# Patient Record
Sex: Male | Born: 1985 | Race: White | Hispanic: No | Marital: Single | Smoking: Never smoker
Health system: Southern US, Community
[De-identification: ages and names within clinical notes are randomized; demographics above are authoritative.]

## PROBLEM LIST (undated history)

## (undated) DIAGNOSIS — F319 Bipolar disorder, unspecified: Secondary | ICD-10-CM

## (undated) DIAGNOSIS — S32009A Unspecified fracture of unspecified lumbar vertebra, initial encounter for closed fracture: Secondary | ICD-10-CM

## (undated) HISTORY — PX: COSMETIC SURGERY: SHX468

---
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
## 2007-03-06 ENCOUNTER — Emergency Department (HOSPITAL_COMMUNITY): Admission: EM | Admit: 2007-03-06 | Discharge: 2007-03-06 | Payer: Self-pay | Admitting: Family Medicine

## 2007-03-15 ENCOUNTER — Ambulatory Visit (HOSPITAL_BASED_OUTPATIENT_CLINIC_OR_DEPARTMENT_OTHER): Admission: RE | Admit: 2007-03-15 | Discharge: 2007-03-15 | Payer: Self-pay | Admitting: Otolaryngology
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
## 2011-06-12 LAB — POCT HEMOGLOBIN-HEMACUE
Hemoglobin: 17
Operator id: 128471

## 2014-04-08 ENCOUNTER — Ambulatory Visit
Admission: RE | Admit: 2014-04-08 | Discharge: 2014-04-08 | Disposition: A | Discharge status: Home / Self Care | Payer: 59 | Source: Ambulatory Visit | Attending: Physician Assistant | Admitting: Physician Assistant

## 2014-04-08 ENCOUNTER — Other Ambulatory Visit: Payer: Self-pay | Admitting: Physician Assistant

## 2014-04-08 DIAGNOSIS — R509 Fever, unspecified: Secondary | ICD-10-CM

## 2014-04-09 ENCOUNTER — Other Ambulatory Visit: Payer: Self-pay | Admitting: Family Medicine

## 2014-04-09 DIAGNOSIS — R945 Abnormal results of liver function studies: Principal | ICD-10-CM

## 2014-04-09 DIAGNOSIS — R7989 Other specified abnormal findings of blood chemistry: Secondary | ICD-10-CM

## 2014-04-13 ENCOUNTER — Ambulatory Visit
Admission: RE | Admit: 2014-04-13 | Discharge: 2014-04-13 | Disposition: A | Discharge status: Home / Self Care | Payer: 59 | Source: Ambulatory Visit | Attending: Family Medicine | Admitting: Family Medicine

## 2014-04-13 DIAGNOSIS — R945 Abnormal results of liver function studies: Principal | ICD-10-CM

## 2014-04-13 DIAGNOSIS — R7989 Other specified abnormal findings of blood chemistry: Secondary | ICD-10-CM

## 2014-04-16 ENCOUNTER — Other Ambulatory Visit: Payer: 59

## 2014-12-13 ENCOUNTER — Emergency Department (INDEPENDENT_AMBULATORY_CARE_PROVIDER_SITE_OTHER)
Admission: EM | Admit: 2014-12-13 | Discharge: 2014-12-13 | Disposition: A | Discharge status: Home / Self Care | Payer: Worker's Compensation | Source: Home / Self Care | Attending: Family Medicine | Admitting: Family Medicine

## 2014-12-13 ENCOUNTER — Encounter (HOSPITAL_COMMUNITY): Payer: Self-pay | Admitting: *Deleted

## 2014-12-13 DIAGNOSIS — M545 Low back pain, unspecified: Secondary | ICD-10-CM

## 2014-12-13 HISTORY — DX: Bipolar disorder, unspecified: F31.9

## 2014-12-13 HISTORY — DX: Unspecified fracture of unspecified lumbar vertebra, initial encounter for closed fracture: S32.009A

## 2014-12-13 MED ORDER — TIZANIDINE HCL 4 MG PO TABS: 4.0000 mg | ORAL_TABLET | Freq: Three times a day (TID) | ORAL | Status: AC | PRN

## 2014-12-13 NOTE — Discharge Instructions (Signed)
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
## 2014-12-13 NOTE — ED Notes (Signed)
Pt reports feeling a "pop" in left low back when standing up out of a chair while at work @ approx 0700 today.  C/O pain across low back (R>L) without radiation.  Denies parasthesias.  Has taken IBU.

## 2017-01-10 ENCOUNTER — Other Ambulatory Visit: Payer: Self-pay | Admitting: Physical Medicine and Rehabilitation

## 2017-01-10 DIAGNOSIS — G8929 Other chronic pain: Secondary | ICD-10-CM

## 2017-01-10 DIAGNOSIS — M545 Low back pain: Principal | ICD-10-CM

## 2017-01-12 ENCOUNTER — Ambulatory Visit
Admission: RE | Admit: 2017-01-12 | Discharge: 2017-01-12 | Disposition: A | Discharge status: Home / Self Care | Payer: Commercial Managed Care - HMO | Source: Ambulatory Visit | Attending: Physical Medicine and Rehabilitation | Admitting: Physical Medicine and Rehabilitation

## 2017-01-12 DIAGNOSIS — G8929 Other chronic pain: Secondary | ICD-10-CM

## 2017-01-12 DIAGNOSIS — M545 Low back pain: Principal | ICD-10-CM

## 2019-02-23 IMAGING — MR MR LUMBAR SPINE W/O CM
4 of 5 series · 18 of 48 positions shown · non-contrast
Comparison: [REDACTED] Lumbar MRI
09/08/2009

CLINICAL DATA: 30-year-old male with chronic intense lumbar back
pain radiating to both legs. Progressive symptoms.

EXAM:
MRI LUMBAR SPINE WITHOUT CONTRAST
TECHNIQUE: Multiplanar, multisequence MR imaging of the lumbar spine was
performed. No intravenous contrast was administered.

[Series 6: T2 · sagittal · 4.0mm · 0.73mm/px · 6 of 13 slices shown (1 of 2)]
[im 1/13]
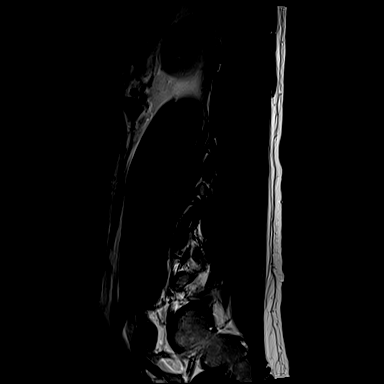
[im 3/13]
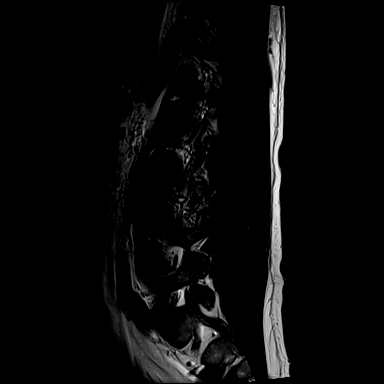
[im 5/13]
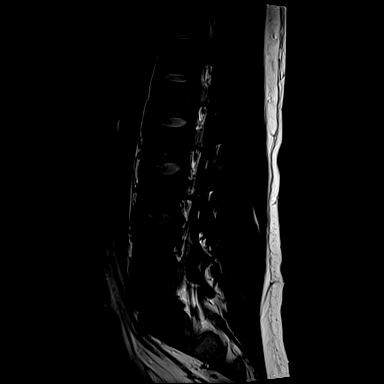
[im 8/13]
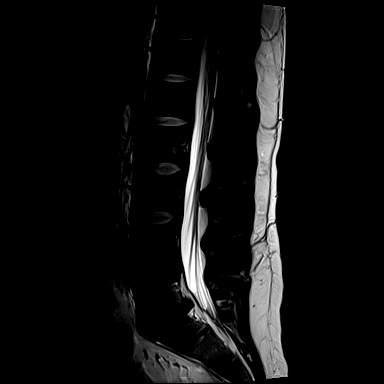
[im 10/13]
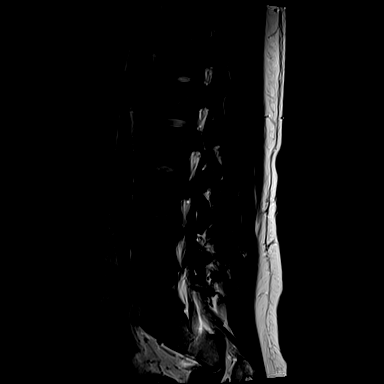
[im 13/13]
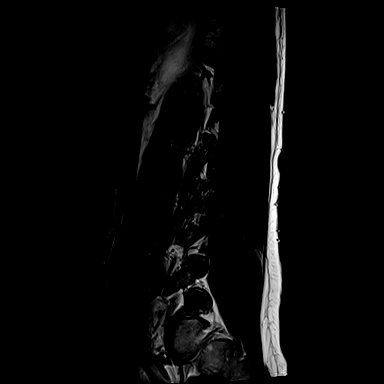

[Series 7: T1 · sagittal · 4.0mm · 0.73mm/px · 3 of 13 slices shown (1 of 2)]
[im 3/13]
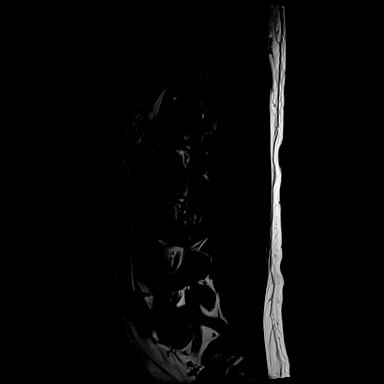
[im 8/13]
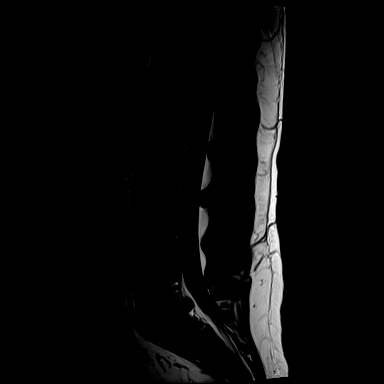
[im 13/13]
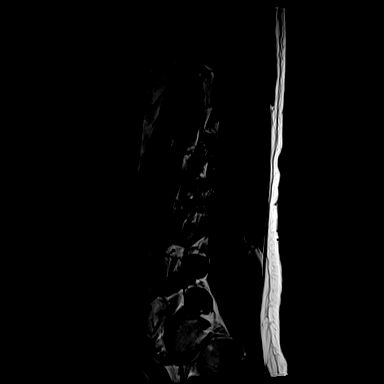

[Series 11: T1 · axial · 4.0mm · 0.28mm/px · z∈[-105,+27]mm · 3 of 33 slices shown (2 of 2)]
[im 5/33]
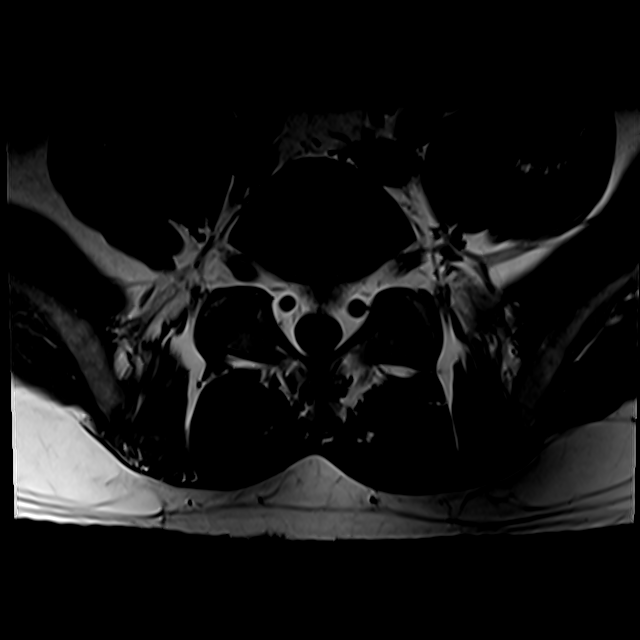
[im 17/33]
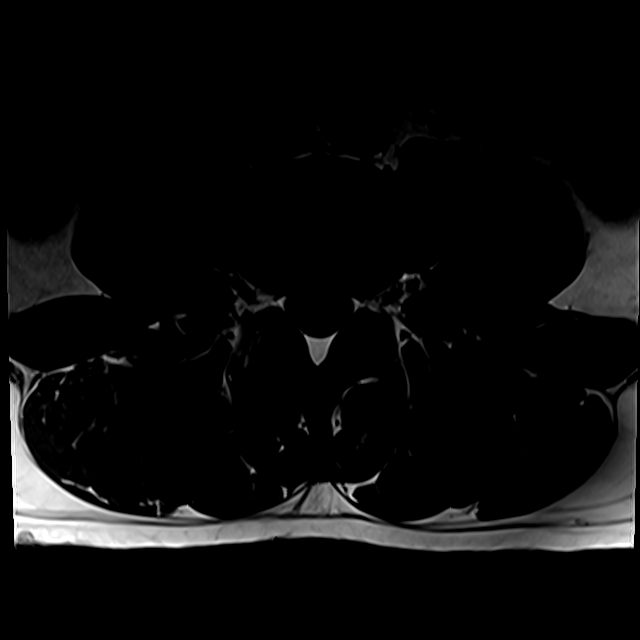
[im 28/33]
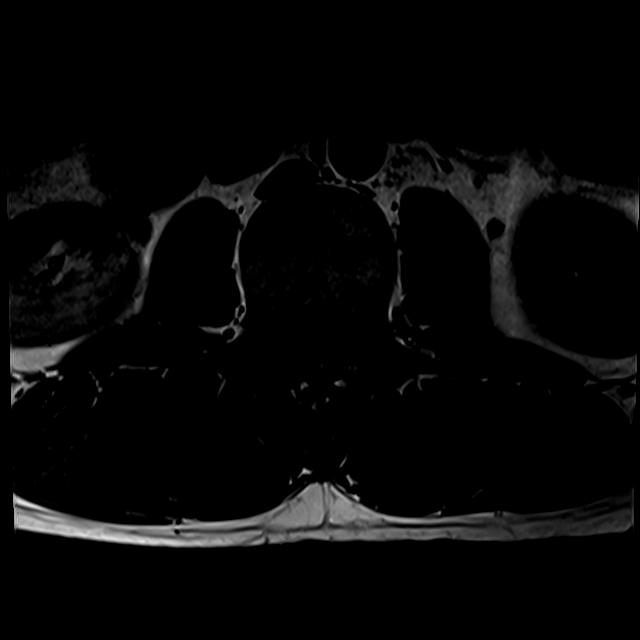

[Series 14: T2 · axial · 4.0mm · 0.28mm/px · z∈[-125,+27]mm · 6 of 33 slices shown (2 of 2)]
[im 1/33]
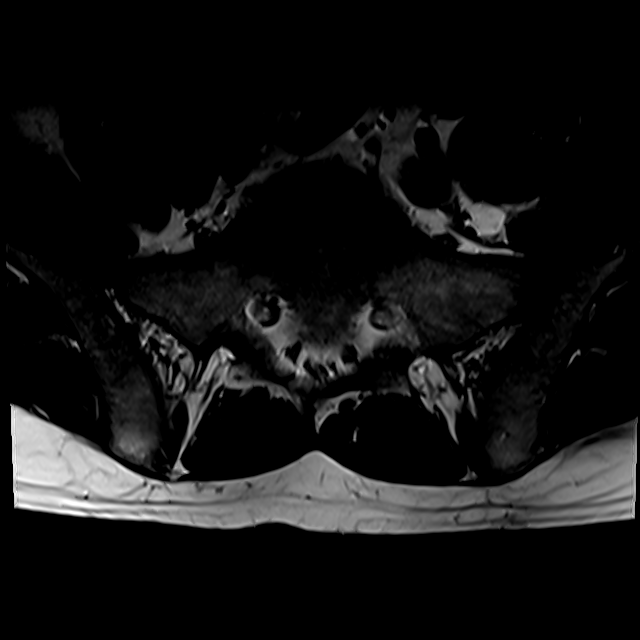
[im 5/33]
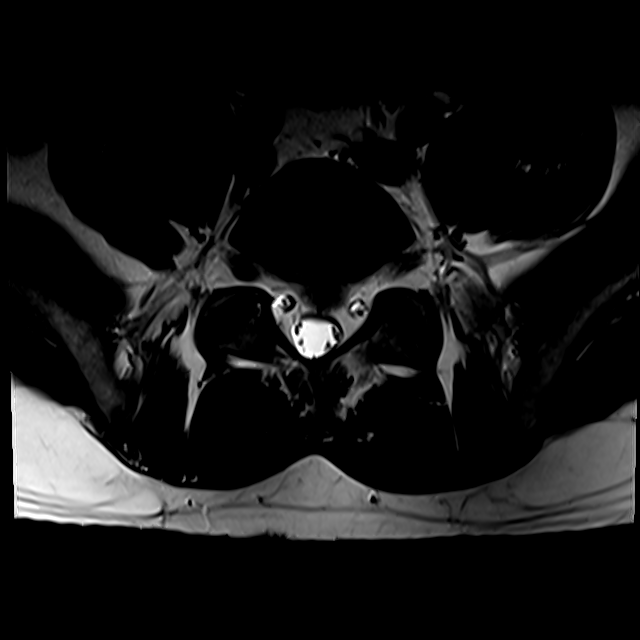
[im 10/33]
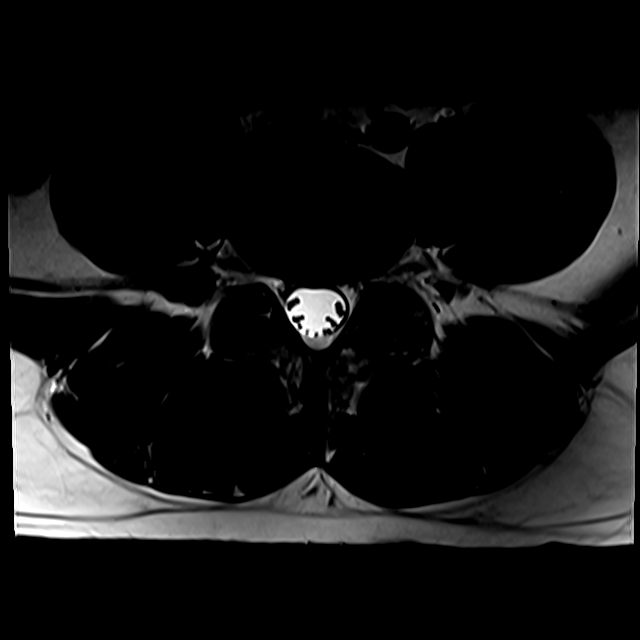
[im 14/33]
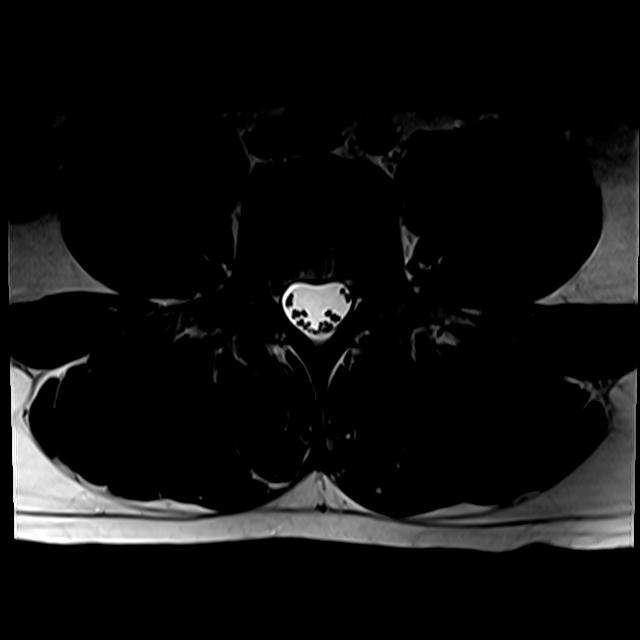
[im 17/33]
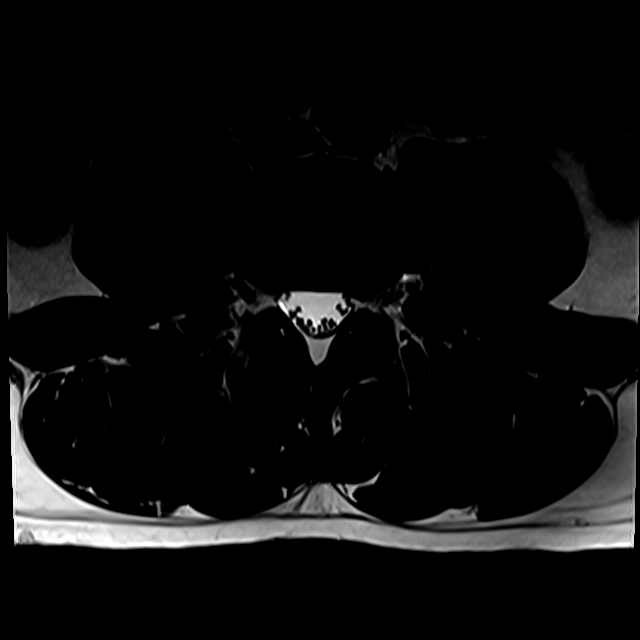
[im 28/33]
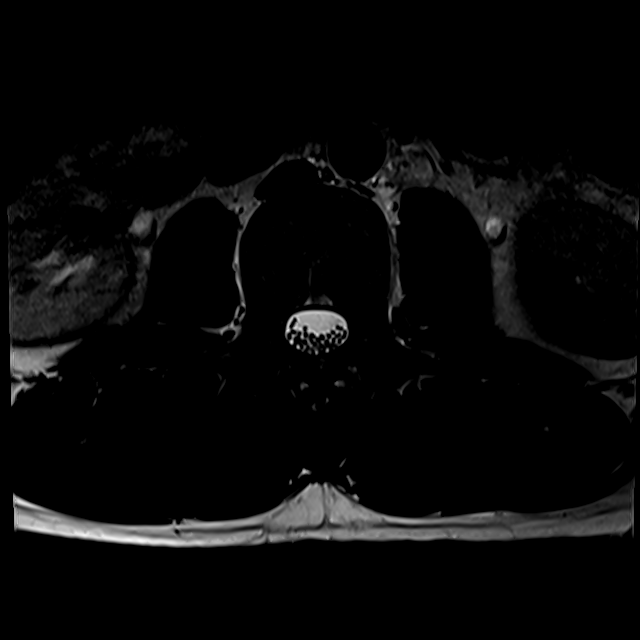

[18 of 48 positions shown; findings below may reference images not displayed]

FINDINGS: Segmentation: Lumbar segmentation appears to be normal Which
corresponds to the same numbering system used in 8777.

Alignment:  Stable.  Chronic straightening of lumbar lordosis.

Vertebrae: No marrow edema or evidence of acute osseous abnormality.
Visualized bone marrow signal is within normal limits. Negative
visible sacrum.

Conus medullaris: Extends to the T12-L1 level and appears normal.

Paraspinal and other soft tissues: Negative.

Disc levels:

T11-T12: Small chronic superior endplate Schmorl nodes at T12,
otherwise negative.

T12-L1:  Negative.

L1-L2:  Negative.

L2-L3:  Negative.

L3-L4:  Negative.

L4-L5: Progression of disc desiccation since 8777. At the same time,
a small central broad-based disc protrusion seen in 8777 has mildly
regressed. There is borderline to mild facet hypertrophy. No
significant stenosis.

L5-S1: Chronic disc desiccation. Mild disc loss. Mild
circumferential disc bulge. Progressed broad-based central disc
protrusion where previously a small annular fissure and subtle
protrusion were present. This occupies the ventral epidural space
(series 14, image 30) and does not appear to directly contact the
descending S1 nerve roots in the lateral recesses. Mild epidural
lipomatosis at this level. No spinal stenosis or foraminal
involvement.
IMPRESSION: 1. Progressed disc degeneration at L5-S1 with a broad-based small to
moderate size central disc herniation. Disc material is in proximity
to the descending S1 nerve roots in the lateral recesses, and could
be a source for S1 radiculitis, although no spinal stenosis or
direct neural impingement is evident.
2. Mild chronic L4-L5 disc degeneration, although a subtle disc
herniation seen there in 8777 has regressed.

## 2019-09-02 ENCOUNTER — Other Ambulatory Visit: Payer: Self-pay

## 2019-09-02 DIAGNOSIS — Z20822 Contact with and (suspected) exposure to covid-19: Secondary | ICD-10-CM

## 2019-09-03 LAB — NOVEL CORONAVIRUS, NAA: SARS-CoV-2, NAA: NOT DETECTED

## 2020-03-17 ENCOUNTER — Other Ambulatory Visit: Payer: Self-pay | Admitting: Physical Medicine and Rehabilitation

## 2020-03-18 ENCOUNTER — Other Ambulatory Visit: Payer: Self-pay | Admitting: Physical Medicine and Rehabilitation

## 2020-03-18 DIAGNOSIS — M549 Dorsalgia, unspecified: Secondary | ICD-10-CM

## 2020-03-18 DIAGNOSIS — M51379 Other intervertebral disc degeneration, lumbosacral region without mention of lumbar back pain or lower extremity pain: Secondary | ICD-10-CM

## 2020-03-18 DIAGNOSIS — M545 Low back pain, unspecified: Secondary | ICD-10-CM

## 2020-03-18 DIAGNOSIS — M5137 Other intervertebral disc degeneration, lumbosacral region: Secondary | ICD-10-CM

## 2020-04-11 ENCOUNTER — Ambulatory Visit
Admission: RE | Admit: 2020-04-11 | Discharge: 2020-04-11 | Disposition: A | Discharge status: Home / Self Care | Payer: Commercial Managed Care - HMO | Source: Ambulatory Visit | Attending: Physical Medicine and Rehabilitation | Admitting: Physical Medicine and Rehabilitation

## 2020-04-11 ENCOUNTER — Other Ambulatory Visit: Payer: Commercial Managed Care - HMO

## 2020-04-11 DIAGNOSIS — M549 Dorsalgia, unspecified: Secondary | ICD-10-CM

## 2020-04-11 DIAGNOSIS — M5137 Other intervertebral disc degeneration, lumbosacral region: Secondary | ICD-10-CM

## 2020-04-11 DIAGNOSIS — M545 Low back pain, unspecified: Secondary | ICD-10-CM

## 2023-01-14 ENCOUNTER — Ambulatory Visit
Admission: EM | Admit: 2023-01-14 | Discharge: 2023-01-14 | Disposition: A | Discharge status: Home / Self Care | Payer: 59 | Attending: Internal Medicine | Admitting: Internal Medicine

## 2023-01-14 DIAGNOSIS — R051 Acute cough: Secondary | ICD-10-CM | POA: Diagnosis not present

## 2023-01-14 DIAGNOSIS — J069 Acute upper respiratory infection, unspecified: Secondary | ICD-10-CM

## 2023-01-14 DIAGNOSIS — J029 Acute pharyngitis, unspecified: Secondary | ICD-10-CM | POA: Diagnosis not present

## 2023-01-14 LAB — POCT MONO SCREEN (KUC): Mono, POC: NEGATIVE

## 2023-01-14 MED ORDER — AMOXICILLIN-POT CLAVULANATE 875-125 MG PO TABS: 1.0000 | ORAL_TABLET | Freq: Two times a day (BID) | ORAL | 0 refills | Status: AC

## 2023-01-14 NOTE — Discharge Instructions (Signed)
Your monotest was negative.  I have prescribed an antibiotic for you to take for upper respiratory symptoms, sore throat, cough.  Please follow-up if any symptoms persist or not worsen.
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
## 2023-01-14 NOTE — ED Triage Notes (Signed)
Pt c/o sore throat, mild cough, reports completing azithromycin therapy which did not offer relief. Associated fatigue, malaise. Reports tested covid and strep (-) a few days ago.   Onset ~ 9 days ago

## 2023-04-23 ENCOUNTER — Ambulatory Visit
Admission: EM | Admit: 2023-04-23 | Discharge: 2023-04-23 | Disposition: A | Discharge status: Home / Self Care | Payer: 59 | Attending: Family Medicine | Admitting: Family Medicine

## 2023-04-23 DIAGNOSIS — K30 Functional dyspepsia: Secondary | ICD-10-CM | POA: Insufficient documentation

## 2023-04-23 DIAGNOSIS — R5383 Other fatigue: Secondary | ICD-10-CM | POA: Diagnosis present

## 2023-04-23 DIAGNOSIS — R739 Hyperglycemia, unspecified: Secondary | ICD-10-CM | POA: Diagnosis present

## 2023-04-23 DIAGNOSIS — Z20822 Contact with and (suspected) exposure to covid-19: Secondary | ICD-10-CM | POA: Insufficient documentation

## 2023-04-23 LAB — POCT FASTING CBG KUC MANUAL ENTRY: POCT Glucose (KUC): 155 mg/dL — AB (ref 70–99)

## 2023-04-23 NOTE — ED Triage Notes (Signed)
"  I just started with diarrhea on Friday morning, this remains, nausea now that increased to occasional dizziness". "Very weak today and difficulty concentrating". ? COVID19 exposure. Occasional cough. No runny nose. No sob. No chest pain. No vomiting.

## 2023-04-23 NOTE — Discharge Instructions (Addendum)
You were seen today for not feeling well.  This is likely due to several causes.   We have done a covid test, and blood work today, which will be resulted by tomorrow.  You will be notified if there is anything abnormal or concerning.  In the mean time I recommend you get plenty of rest and fluids.  Please follow up here or go to the ER if anything should worsen.
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
## 2023-04-24 LAB — CBC WITH DIFFERENTIAL/PLATELET
Basophils Absolute: 0.1 10*3/uL (ref 0.0–0.2)
Basos: 1 %
EOS (ABSOLUTE): 0.1 10*3/uL (ref 0.0–0.4)
Eos: 1 %
Hematocrit: 52.8 % — ABNORMAL HIGH (ref 37.5–51.0)
Hemoglobin: 18.2 g/dL — ABNORMAL HIGH (ref 13.0–17.7)
Immature Grans (Abs): 0 10*3/uL (ref 0.0–0.1)
Immature Granulocytes: 1 %
Lymphocytes Absolute: 1.6 10*3/uL (ref 0.7–3.1)
Lymphs: 24 %
MCH: 30.7 pg (ref 26.6–33.0)
MCHC: 34.5 g/dL (ref 31.5–35.7)
MCV: 89 fL (ref 79–97)
Monocytes Absolute: 0.4 10*3/uL (ref 0.1–0.9)
Monocytes: 5 %
Neutrophils Absolute: 4.7 10*3/uL (ref 1.4–7.0)
Neutrophils: 68 %
Platelets: 250 10*3/uL (ref 150–450)
RBC: 5.92 x10E6/uL — ABNORMAL HIGH (ref 4.14–5.80)
RDW: 12.8 % (ref 11.6–15.4)
WBC: 6.8 10*3/uL (ref 3.4–10.8)

## 2023-04-24 LAB — COMPREHENSIVE METABOLIC PANEL
ALT: 28 IU/L (ref 0–44)
AST: 22 IU/L (ref 0–40)
Albumin: 4.5 g/dL (ref 4.1–5.1)
Alkaline Phosphatase: 110 IU/L (ref 44–121)
BUN/Creatinine Ratio: 11 (ref 9–20)
BUN: 12 mg/dL (ref 6–20)
Bilirubin Total: 1 mg/dL (ref 0.0–1.2)
CO2: 21 mmol/L (ref 20–29)
Calcium: 9.4 mg/dL (ref 8.7–10.2)
Chloride: 102 mmol/L (ref 96–106)
Creatinine, Ser: 1.08 mg/dL (ref 0.76–1.27)
Globulin, Total: 2.5 g/dL (ref 1.5–4.5)
Glucose: 111 mg/dL — ABNORMAL HIGH (ref 70–99)
Potassium: 4.3 mmol/L (ref 3.5–5.2)
Sodium: 140 mmol/L (ref 134–144)
Total Protein: 7 g/dL (ref 6.0–8.5)
eGFR: 91 mL/min/{1.73_m2} (ref >59)

## 2023-04-24 LAB — HEMOGLOBIN A1C
Est. average glucose Bld gHb Est-mCnc: 94 mg/dL
Hgb A1c MFr Bld: 4.9 % (ref 4.8–5.6)

## 2023-04-24 LAB — SARS CORONAVIRUS 2 (TAT 6-24 HRS): SARS Coronavirus 2: NEGATIVE
